# Patient Record
Sex: Female | Born: 1955 | Race: White | Hispanic: No | Marital: Single | Smoking: Never smoker
Health system: Southern US, Community
[De-identification: ages and names within clinical notes are randomized; demographics above are authoritative.]

## PROBLEM LIST (undated history)

## (undated) DIAGNOSIS — R011 Cardiac murmur, unspecified: Secondary | ICD-10-CM

## (undated) HISTORY — DX: Cardiac murmur, unspecified: R01.1

---

## 2014-11-03 ENCOUNTER — Ambulatory Visit (INDEPENDENT_AMBULATORY_CARE_PROVIDER_SITE_OTHER): Payer: Managed Care, Other (non HMO) | Admitting: Emergency Medicine

## 2014-11-03 VITALS — BP 118/60 | HR 78 | Temp 100.0°F | Resp 19 | Ht 58.5 in | Wt 149.1 lb

## 2014-11-03 DIAGNOSIS — J029 Acute pharyngitis, unspecified: Secondary | ICD-10-CM | POA: Diagnosis not present

## 2014-11-03 DIAGNOSIS — R5382 Chronic fatigue, unspecified: Secondary | ICD-10-CM | POA: Diagnosis not present

## 2014-11-03 MED ORDER — PENICILLIN V POTASSIUM 500 MG PO TABS: 500.0000 mg | ORAL_TABLET | Freq: Four times a day (QID) | ORAL | Status: DC

## 2014-11-03 NOTE — Patient Instructions (Signed)

## 2014-11-03 NOTE — Progress Notes (Signed)
Subjective:  [name] ID: Madison [name], female    DOB: 07/06/1955  Age: 59 y.o. MRN: [id number]  CC: Sore Throat; Fever; Headache; Fatigue; and Thyroid Problem   HPI Madison [name] presents  with a sore throat and fever. Madison [name] said Madison [name]'s had a temperature up 100. Madison [name] has a longer term history of fatigue and weight gain. Madison [name] is concerned about Madison possibility of hypothyroidism as Madison [name] explains Madison [name] sisters and Madison [name] mother both have hypothyroidism on replacement. Madison [name] denies any cough coryza nausea vomiting stool change or chills. Has no dysuria urgency or frequency.  History Madison [name] has a past medical history of Heart murmur.   Madison [name] has no past surgical history on file.   Madison [name]  family history includes Cancer in Madison [name] father; Heart disease in Madison [name] father; Hyperlipidemia in Madison [name] father and sister; Hypertension in Madison [name] father; Stroke in Madison [name] maternal grandfather and paternal grandfather.  Madison [name]   reports that Madison [name] has never smoked. Madison [name] does not have any smokeless tobacco history on file. Madison [name] reports that Madison [name] drinks about 4.2 oz of alcohol per week. Madison [name] reports that Madison [name] does not use illicit drugs.  No outpatient prescriptions prior to visit.   No facility-administered medications prior to visit.    History   Social History  . Marital Status: Single    Spouse Name: N/A  . Number of Children: N/A  . Years of Education: N/A   Social History Main Topics  . Smoking status: Never Smoker   . Smokeless tobacco: Not on file  . Alcohol Use: 4.2 oz/week    7 Standard drinks or equivalent per week  . Drug Use: No  . Sexual Activity: Not on file   Other Topics Concern  . None   Social History Narrative  . None     Review of Systems  Constitutional: Negative for fever, chills and appetite change.  HENT: Negative for congestion, ear pain, postnasal drip, sinus pressure and sore throat.   Eyes: Negative for pain and redness.  Respiratory: Negative for cough, shortness of breath and wheezing.     Cardiovascular: Negative for leg swelling.  Gastrointestinal: Negative for nausea, vomiting, abdominal pain, diarrhea, constipation and blood in stool.  Endocrine: Negative for polyuria.  Genitourinary: Negative for dysuria, urgency, frequency and flank pain.  Musculoskeletal: Negative for gait problem.  Skin: Negative for rash.  Neurological: Negative for weakness and headaches.  Psychiatric/Behavioral: Negative for confusion and decreased concentration. Madison [name] is not nervous/anxious.     Objective:  BP 118/60 mmHg  Pulse 78  Temp(Src) 100 F (37.8 C) (Oral)  Resp 19  Ht 4' 10.5" (1.486 m)  Wt 149 lb 2 oz (67.643 kg)  BMI 30.63 kg/m2  SpO2 98%  Physical Exam  Constitutional: Madison [name] is oriented to person, place, and time. Madison [name] appears well-developed and well-nourished. No distress.  HENT:  Head: Normocephalic and atraumatic.  Right Ear: External ear normal.  Left Ear: External ear normal.  Nose: Nose normal.  Mouth/Throat: Posterior oropharyngeal erythema present.  Eyes: Conjunctivae and EOM are normal. Pupils are equal, round, and reactive to light. No scleral icterus.  Neck: Normal range of motion. Neck supple. No tracheal deviation present.  Cardiovascular: Normal rate, regular rhythm and normal heart sounds.   Pulmonary/Chest: Effort normal. No respiratory distress. Madison [name] has no wheezes. Madison [name] has no rales.  Abdominal: Madison [name] exhibits no mass. There is no tenderness. There is no rebound and no guarding.  Musculoskeletal: Madison [name] exhibits no edema.  Lymphadenopathy:    Madison [name] has  no cervical adenopathy.  Neurological: Madison [name] is alert and oriented to person, place, and time. Coordination normal.  Skin: Skin is warm and dry. No rash noted.  Psychiatric: Madison [name] has a normal mood and affect. Madison [name] behavior is normal.      Assessment & Plan:   Madison [name] was seen today for sore throat, fever, headache, fatigue and thyroid problem.  Diagnoses and all orders for this visit:  Chronic  fatigue Orders: -     TSH -     Comprehensive metabolic panel -     CBC  Sore throat Orders: -     TSH -     Comprehensive metabolic panel -     CBC  Other orders -     penicillin v potassium (VEETID) 500 MG tablet; Take 1 tablet (500 mg total) by mouth 4 (four) times daily.   I am having Madison [name] start on penicillin v potassium.  Meds ordered this encounter  Medications  . penicillin v potassium (VEETID) 500 MG tablet    Sig: Take 1 tablet (500 mg total) by mouth 4 (four) times daily.    Dispense:  40 tablet    Refill:  0    Appropriate red flag conditions were discussed with Madison [name] as well as actions that should be taken.  [name] expressed his understanding.  Follow-up: Return if symptoms worsen or fail to improve.  Carmelina DaneAnderson, Breckon Reeves S, MD

## 2014-11-04 LAB — CBC
HCT: 39.1 % (ref 36.0–46.0)
HEMOGLOBIN: 13.3 g/dL (ref 12.0–15.0)
MCH: 28.7 pg (ref 26.0–34.0)
MCHC: 34 g/dL (ref 30.0–36.0)
MCV: 84.3 fL (ref 78.0–100.0)
MPV: 9 fL (ref 8.6–12.4)
PLATELETS: 258 10*3/uL (ref 150–400)
RBC: 4.64 MIL/uL (ref 3.87–5.11)
RDW: 13.5 % (ref 11.5–15.5)
WBC: 8.7 10*3/uL (ref 4.0–10.5)

## 2014-11-04 LAB — COMPREHENSIVE METABOLIC PANEL
ALK PHOS: 67 U/L (ref 39–117)
ALT: 39 U/L — AB (ref 0–35)
AST: 37 U/L (ref 0–37)
Albumin: 4.3 g/dL (ref 3.5–5.2)
BUN: 14 mg/dL (ref 6–23)
CHLORIDE: 105 meq/L (ref 96–112)
CO2: 27 mEq/L (ref 19–32)
CREATININE: 0.89 mg/dL (ref 0.50–1.10)
Calcium: 9.6 mg/dL (ref 8.4–10.5)
Glucose, Bld: 81 mg/dL (ref 70–99)
Potassium: 4.3 mEq/L (ref 3.5–5.3)
SODIUM: 143 meq/L (ref 135–145)
TOTAL PROTEIN: 7 g/dL (ref 6.0–8.3)
Total Bilirubin: 0.5 mg/dL (ref 0.2–1.2)

## 2014-11-04 LAB — TSH: TSH: 2.037 u[IU]/mL (ref 0.350–4.500)

## 2015-07-25 ENCOUNTER — Ambulatory Visit (INDEPENDENT_AMBULATORY_CARE_PROVIDER_SITE_OTHER): Payer: 59 | Admitting: Family Medicine

## 2015-07-25 VITALS — BP 130/70 | HR 93 | Temp 102.1°F | Resp 16 | Ht <= 58 in | Wt 148.0 lb

## 2015-07-25 DIAGNOSIS — R059 Cough, unspecified: Secondary | ICD-10-CM

## 2015-07-25 DIAGNOSIS — R509 Fever, unspecified: Secondary | ICD-10-CM

## 2015-07-25 DIAGNOSIS — Z20828 Contact with and (suspected) exposure to other viral communicable diseases: Secondary | ICD-10-CM | POA: Diagnosis not present

## 2015-07-25 DIAGNOSIS — R05 Cough: Secondary | ICD-10-CM | POA: Diagnosis not present

## 2015-07-25 MED ORDER — OSELTAMIVIR PHOSPHATE 75 MG PO CAPS: 75.0000 mg | ORAL_CAPSULE | Freq: Two times a day (BID) | ORAL | Status: DC

## 2015-07-25 MED ORDER — IBUPROFEN 200 MG PO TABS
400.0000 mg | ORAL_TABLET | Freq: Once | ORAL | Status: AC
  Administered 2015-07-25: 400 mg via ORAL

## 2015-07-25 NOTE — Progress Notes (Signed)
Subjective:  By signing my name below, I, Essence Howell, attest that this documentation has been prepared under the direction and in the presence of Shade Flood, MD Electronically Signed: Charline Bills, ED Scribe 07/25/2015 at 5:52 PM.   Patient ID: Madison [name], female    DOB: 1955-06-02, 60 y.o.   MRN: [id number]  Chief Complaint  Patient presents with  . Cough    x this morning   . Fever  . Chills   HPI  HPI Comments: Madison [name] is a 60 y.o. female who presents to the [facility] complaining of gradual onset of fever onset this morning. Triage temperature 102. F. Pt reports associated symptoms of fatigue, chills, postnasal drip and dry cough onset this morning. No treatments tried PTA. She denies nasal congestion, rhinorrhea, generalized body aches, HA. Pt reports being with her sister 1-2 days ago who was recently diagnosed with influenza A. Pt did not receive an influenza vaccine this season. She denies h/o asthma, COPD or any other pulmonary related illnesses. Pt is a nonsmoker.   There are no active problems to display for this patient.  Past Medical History  Diagnosis Date  . Heart murmur    History reviewed. No pertinent past surgical history. No Known Allergies Prior to Admission medications   Not on File   Social History   Social History  . Marital Status: Single    Spouse Name: N/A  . Number of Children: N/A  . Years of Education: N/A   Occupational History  . Not on file.   Social History Main Topics  . Smoking status: Never Smoker   . Smokeless tobacco: Never Used  . Alcohol Use: 4.2 oz/week    7 Standard drinks or equivalent per week  . Drug Use: No  . Sexual Activity: Not on file   Other Topics Concern  . Not on file   Social History Narrative   Review of Systems  Constitutional: Positive for fever, chills and fatigue.  HENT: Positive for postnasal drip. Negative for congestion and rhinorrhea.   Respiratory: Positive for  cough.   Musculoskeletal: Negative for myalgias.  Neurological: Negative for headaches.      Objective:   Physical Exam  Constitutional: She is oriented to person, place, and time. She appears well-developed and well-nourished. No distress.  HENT:  Head: Normocephalic and atraumatic.  Right Ear: Hearing, tympanic membrane, external ear and ear canal normal.  Left Ear: Hearing, tympanic membrane, external ear and ear canal normal.  Nose: Nose normal.  Mouth/Throat: Oropharynx is clear and moist. No oropharyngeal exudate.  Eyes: Conjunctivae and EOM are normal. Pupils are equal, round, and reactive to light.  Cardiovascular: Normal rate, regular rhythm, normal heart sounds and intact distal pulses.   No murmur heard. Pulmonary/Chest: Effort normal and breath sounds normal. No respiratory distress. She has no wheezes. She has no rhonchi.  Neurological: She is alert and oriented to person, place, and time.  Skin: Skin is warm and dry. No rash noted.  Psychiatric: She has a normal mood and affect. Her behavior is normal.  Vitals reviewed.     Assessment & Plan:   Madison [name] is a 60 y.o. female Exposure to influenza - Plan: oseltamivir (TAMIFLU) 75 MG capsule  Fever, unspecified - Plan: ibuprofen (ADVIL,MOTRIN) tablet 400 mg, oseltamivir (TAMIFLU) 75 MG capsule  Cough - Plan: oseltamivir (TAMIFLU) 75 MG capsule  Clinically appears to be influenza. Declined flu testing. We'll treat as likely flu with Tamiflu  75 mg twice a day after discussion of pros and cons of this medicine were discussed. . Symptomatically care discussed with Mucinex, antipyretics, RTC precautions  Meds ordered this encounter  Medications  . ibuprofen (ADVIL,MOTRIN) tablet 400 mg    Sig:   . oseltamivir (TAMIFLU) 75 MG capsule    Sig: Take 1 capsule (75 mg total) by mouth 2 (two) times daily.    Dispense:  10 capsule    Refill:  0   Patient Instructions       IF you received an x-ray today, you will  receive an invoice from [facility]. Please contact [facility] at [phone] with questions or concerns regarding your invoice.   IF you received labwork today, you will receive an invoice from [employer] [facility]. Please contact [facility] at [phone] with questions or concerns regarding your invoice.   Our billing staff will not be able to assist you with questions regarding bills from these companies.  You will be contacted with the lab results as soon as they are available. The fastest way to get your results is to activate your My Chart account. Instructions are located on the last page of this paperwork. If you have not heard from [place] regarding the results in 2 weeks, please contact this office.    Can start Tamiflu as discussed. Saline nasal spray at least 4 times per day if needed for nasal congestion, over the counter mucinex or mucinex DM as needed for cough, tylenol or ibuprofen over the counter for fever and body aches, and drink plenty of fluids. Other information as in instructions below.  Return to the clinic or go to the nearest emergency room if any of your symptoms worsen or new symptoms occur.Out of work until you are fever free for 24 hours off a fever reducing medication.  Influenza, Adult Influenza ("the flu") is a viral infection of the respiratory tract. It occurs more often in winter months because people spend more time in close contact with one another. Influenza can make you feel very sick. Influenza easily spreads from person to person (contagious). CAUSES  Influenza is caused by a virus that infects the respiratory tract. You can catch the virus by breathing in droplets from an infected person's cough or sneeze. You can also catch the virus by touching something that was recently contaminated with the virus and then touching your mouth, nose, or eyes. RISKS AND COMPLICATIONS You may be at risk for a more severe case of influenza if  you smoke cigarettes, have diabetes, have chronic heart disease (such as heart failure) or lung disease (such as asthma), or if you have a weakened immune system. Elderly people and pregnant women are also at risk for more serious infections. The most common problem of influenza is a lung infection (pneumonia). Sometimes, this problem can require emergency medical care and may be life threatening. SIGNS AND SYMPTOMS  Symptoms typically last 4 to 10 days and may include:  Fever.  Chills.  Headache, body aches, and muscle aches.  Sore throat.  Chest discomfort and cough.  Poor appetite.  Weakness or feeling tired.  Dizziness.  Nausea or vomiting. DIAGNOSIS  Diagnosis of influenza is often made based on your history and a physical exam. A nose or throat swab test can be done to confirm the diagnosis. TREATMENT  In mild cases, influenza goes away on its own. Treatment is directed at relieving symptoms. For more severe cases, your health care provider may prescribe antiviral medicines  to shorten the sickness. Antibiotic medicines are not effective because the infection is caused by a virus, not by bacteria. HOME CARE INSTRUCTIONS  Take medicines only as directed by your health care provider.  Use a cool mist humidifier to make breathing easier.  Get plenty of rest until your temperature returns to normal. This usually takes 3 to 4 days.  Drink enough fluid to keep your urine clear or pale yellow.  Cover yourmouth and nosewhen coughing or sneezing,and wash your handswellto prevent thevirusfrom spreading.  Stay homefromwork orschool untilthe fever is gonefor at least 1full day.49 PREVENTION  An annual influenza vaccination (flu shot) is the best way to avoid getting influenza. An annual flu shot is now routinely recommended for all adults in the [place] SEEK MEDICAL CARE IF:  You experiencechest pain, yourcough worsens,or you producemore mucus.  Youhave  nausea,vomiting, ordiarrhea.  Your fever returns or gets worse. SEEK IMMEDIATE MEDICAL CARE IF:  You havetrouble breathing, you become short of breath,or your skin ornails becomebluish.  You have severe painor stiffnessin the neck.  You develop a sudden headache, or pain in the face or ear.  You have nausea or vomiting that you cannot control. MAKE SURE YOU:   Understand these instructions.  Will watch your condition.  Will get help right away if you are not doing well or get worse.   This information is not intended to replace advice given to you by your health care provider. Make sure you discuss any questions you have with your health care provider.   Document Released: 03/30/2000 Document Revised: 04/23/2014 Document Reviewed: 07/02/2011 Elsevier Interactive Patient Education [employer]2016 Elsevier Inc.       I personally performed the services described in this documentation, which was scribed in my presence. The recorded information has been reviewed and considered, and addended by me as needed.

## 2015-07-25 NOTE — Patient Instructions (Addendum)
IF you received an x-ray today, you will receive an invoice from [facility]. Please contact [facility] at [phone] with questions or concerns regarding your invoice.   IF you received labwork today, you will receive an invoice from [facility]. Please contact [facility] at [phone] with questions or concerns regarding your invoice.   Our billing staff will not be able to assist you with questions regarding bills from these companies.  You will be contacted with the lab results as soon as they are available. The fastest way to get your results is to activate your My Chart account. Instructions are located on the last page of this paperwork. If you have not heard from us regarding the results in 2 weeks, please contact this office.    Can start Tamiflu as discussed. Saline nasal spray at least 4 times per day if needed for nasal congestion, over the counter mucinex or mucinex DM as needed for cough, tylenol or ibuprofen over the counter for fever and body aches, and drink plenty of fluids. Other information as in instructions below.  Return to the clinic or go to the nearest emergency room if any of your symptoms worsen or new symptoms occur.Out of work until you are fever free for 24 hours off a fever reducing medication.  Influenza, Adult Influenza ("the flu") is a viral infection of the respiratory tract. It occurs more often in winter months because people spend more time in close contact with one another. Influenza can make you feel very sick. Influenza easily spreads from person to person (contagious). CAUSES  Influenza is caused by a virus that infects the respiratory tract. You can catch the virus by breathing in droplets from an infected person's cough or sneeze. You can also catch the virus by touching something that was recently contaminated with the virus and then touching your mouth, nose, or eyes. RISKS AND COMPLICATIONS You may  be at risk for a more severe case of influenza if you smoke cigarettes, have diabetes, have chronic heart disease (such as heart failure) or lung disease (such as asthma), or if you have a weakened immune system. Elderly people and pregnant women are also at risk for more serious infections. The most common problem of influenza is a lung infection (pneumonia). Sometimes, this problem can require emergency medical care and may be life threatening. SIGNS AND SYMPTOMS  Symptoms typically last 4 to 10 days and may include:  Fever.  Chills.  Headache, body aches, and muscle aches.  Sore throat.  Chest discomfort and cough.  Poor appetite.  Weakness or feeling tired.  Dizziness.  Nausea or vomiting. DIAGNOSIS  Diagnosis of influenza is often made based on your history and a physical exam. A nose or throat swab test can be done to confirm the diagnosis. TREATMENT  In mild cases, influenza goes away on its own. Treatment is directed at relieving symptoms. For more severe cases, your health care provider may prescribe antiviral medicines to shorten the sickness. Antibiotic medicines are not effective because the infection is caused by a virus, not by bacteria. HOME CARE INSTRUCTIONS  Take medicines only as directed by your health care provider.  Use a cool mist humidifier to make breathing easier.  Get plenty of rest until your temperature returns to normal. This usually takes 3 to 4 days.  Drink enough fluid to keep your urine clear or pale yellow.  Cover yourmouth and nosewhen coughing or sneezing,and wash your handswellto prevent thevirusfrom spreading.  Stay homefromwork orschool untilthe fever is gonefor at least 34full day. PREVENTION  An annual influenza vaccination (flu shot) is the best way to avoid getting influenza. An annual flu shot is now routinely recommended for all adults in the [place] SEEK MEDICAL CARE IF:  You experiencechest pain, yourcough worsens,or  you producemore mucus.  Youhave nausea,vomiting, ordiarrhea.  Your fever returns or gets worse. SEEK IMMEDIATE MEDICAL CARE IF:  You havetrouble breathing, you become short of breath,or your skin ornails becomebluish.  You have severe painor stiffnessin the neck.  You develop a sudden headache, or pain in the face or ear.  You have nausea or vomiting that you cannot control. MAKE SURE YOU:   Understand these instructions.  Will watch your condition.  Will get help right away if you are not doing well or get worse.   This information is not intended to replace advice given to you by your health care provider. Make sure you discuss any questions you have with your health care provider.   Document Released: 03/30/2000 Document Revised: 04/23/2014 Document Reviewed: 07/02/2011 Elsevier Interactive Patient Education [employer]

## 2016-04-17 ENCOUNTER — Inpatient Hospital Stay: Admit: 2016-04-17 | Payer: BLUE CROSS/BLUE SHIELD

## 2016-04-18 NOTE — Progress Notes (Signed)
[facility]  [address], [address], [place], [place] [id number]  Phone: [phone]  Fax: [phone]   Nutrition Assessment ??? Medical Nutrition Therapy   Outpatient Initial Evaluation         Patient Name: Sabrina Hampton DOB: 02/25/1956   Treatment Diagnosis: obesity   Referral Source: Dellia CloudWatson, Betyshia J, MD Start of Care [facility]): 04-17-2016     Gender: female Age: 61 y.o.   Ht: 62 In Wt: 208 lb  kg   BMI: 38 BMR   Female  BMR Female 1471   Anthropometrics Assessment: Pt is considered obese, per BMI.     Past Medical History includes: HTN; HLD;      Pertinent Medications:        Biochemical Data:   No results found for: HBA1C, HGBE8, HBA1CPOC, HBA1CEXT  No results found for: CHOL, CHOLPOCT, CHOLX, CHLST, CHOLV, HDL, HDLPOC, LDL, LDLCPOC, LDLC, DLDLP, VLDLC, VLDL, TGLX, TRIGL, TRIGP, TGLPOCT, CHHD, CHHDX  No results found for: GPT, ALT, SGOT, GGT, GGTP, AP, APIT, APX, CBIL, TBIL, TBILI  No results found for: CRES, CREAPOC, MCREA, ACREA, CREA, REFC3, REFC4  No results found for: BUN, BUNPOC, IBUN, MBUNV, BUNV  No results found for: MCACR, MCA1, MCA2, MCA3, MCAU, MCAU2, MCALPOCT     Subjective/Assessment:     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  She has finally decided to make healthier changes in her diet and lifestyle.  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  Her next trip is scheduled for the end of January.  Pt works from home when she is not traveling and lives with her husband.  Although she is motivated to improve her health and wellness, pt is not ready to make the necessary changes aggressively.       Current Eating Patterns: Pt's current diet is very inconsistent in timing and content.  Her overall intake is typically high in carbohydrate and fat.  She admits to having 1 drink per day (heavy beer or bourbon straight).       Estimate Needs   Calories: 1400  Protein: 105 Carbs: 140 Fat: 47   Kcal/day  g/day  g/day  g/day         percent: 30  40  30               Education & Recommendations provided: Provided pt with information regarding macronutrient composition of favorite foods, along with specific recommendations for intake of each throughout the day.   Handouts Provided:   Carbohydrates    Protein    Fiber    Serving [employer] Insurance[employer]izes    Meal and Snack Ideas    Food Journals   Diabetes    Cholesterol    Sodium    Gen Nutr Guidelines    SBGM Guidelines    Others: Meal Builder and diet plan based on 1400 calories   Information Reviewed with: pt   Readiness to Change Stage:   Pre-contemplative      Contemplative    Preparation                 Action                    Maintenance   Potential Barriers to Learning:   Decline in memory      Language barrier     Other:    Emotional  Limited mobility    Lack of motivation      Vision, hearing or cognitive impairment   Expected Compliance: / Fair due to pt's current level of motivation.     Nutritional Goal - To promote lifestyle changes to result in:      Weight loss    Improved diabetic control    Decreased cholesterol levels    Decreased blood pressure    Weight maintenance   Preventing any interactions associated with food allergies    Adequate weight gain toward goal weight    Other:        Patient Goals:  ???SMART goals??? 1. Always combine protein and carbohydrate  2. Eat 2-3 hrs after snacks and 4-5 hrs after meals     Dietitian Signature: Jackolyn Confer, MS, RD, CSSD Date: 04/18/2016   Follow-up: Wed Jan 17 @ 11:30 Time: 1:11 PM

## 2017-06-22 ENCOUNTER — Other Ambulatory Visit: Payer: Self-pay

## 2017-06-22 ENCOUNTER — Encounter (HOSPITAL_COMMUNITY): Payer: Self-pay | Admitting: *Deleted

## 2017-06-22 ENCOUNTER — Ambulatory Visit (INDEPENDENT_AMBULATORY_CARE_PROVIDER_SITE_OTHER): Payer: 59

## 2017-06-22 ENCOUNTER — Ambulatory Visit (HOSPITAL_COMMUNITY)
Admission: EM | Admit: 2017-06-22 | Discharge: 2017-06-22 | Disposition: A | Discharge status: Home / Self Care | Payer: 59 | Attending: Family Medicine | Admitting: Family Medicine

## 2017-06-22 DIAGNOSIS — W19XXXA Unspecified fall, initial encounter: Secondary | ICD-10-CM | POA: Diagnosis not present

## 2017-06-22 DIAGNOSIS — S52502A Unspecified fracture of the lower end of left radius, initial encounter for closed fracture: Secondary | ICD-10-CM | POA: Diagnosis not present

## 2017-06-22 MED ORDER — MELOXICAM 7.5 MG PO TABS: 7.5000 mg | ORAL_TABLET | Freq: Every day | ORAL | 0 refills | Status: DC

## 2017-06-22 MED ORDER — TRAMADOL HCL 50 MG PO TABS: 50.0000 mg | ORAL_TABLET | Freq: Four times a day (QID) | ORAL | 0 refills | Status: DC | PRN

## 2017-06-22 NOTE — ED Provider Notes (Signed)
[facility]    CSN: [id number] Arrival date & time: 06/22/17  1431     History   Chief Complaint Chief Complaint  Patient presents with  . Wrist Injury    HPI Madison [name] is a 62 y.o. female.   62 year old female comes in for left wrist pain after fall earlier today. States she was rollerblading, when she fell backwards.  She stretched her arms backwards to break the fall, and has since had left wrist pain.  She denies head injury, loss of consciousness.  Denies back pain, saddle anesthesia, loss of bladder or bowel control.  Has swelling to the radial aspect of the wrist, with painful and decreased range of motion.  Denies numbness, tingling.  Able to move her fingers.  Took ibuprofen with little relief.  Has been doing ice compress to help with the swelling.      Past Medical History:  Diagnosis Date  . Heart murmur     There are no active problems to display for this patient.   History reviewed. No pertinent surgical history.  OB History    No data available       Home Medications    Prior to Admission medications   Medication Sig Start Date End Date Taking? Authorizing Provider  meloxicam (MOBIC) 7.5 MG tablet Take 1 tablet (7.5 mg total) by mouth daily. 06/22/17   Cathie HoopsYu, Francia Verry V, PA-C  traMADol (ULTRAM) 50 MG tablet Take 1 tablet (50 mg total) by mouth every 6 (six) hours as needed. 06/22/17   Belinda FisherYu, Harmani Neto V, PA-C    Family History Family History  Problem Relation Age of Onset  . Cancer Father   . Heart disease Father   . Hyperlipidemia Father   . Hypertension Father   . Stroke Maternal Grandfather   . Stroke Paternal Grandfather   . Hyperlipidemia Sister     Social History Social History   Tobacco Use  . Smoking status: Never Smoker  . Smokeless tobacco: Never Used  Substance Use Topics  . Alcohol use: Yes    Alcohol/week: 4.2 oz    Types: 7 Standard drinks or equivalent per week  . Drug use: No     Allergies   Patient has no known  allergies.   Review of Systems Review of Systems  Reason unable to perform ROS: See HPI as above.     Physical Exam Triage Vital Signs ED Triage Vitals  Enc Vitals Group     BP 06/22/17 1616 (!) 164/70     Pulse Rate 06/22/17 1616 61     Resp 06/22/17 1616 16     Temp 06/22/17 1616 98.9 F (37.2 C)     Temp Source 06/22/17 1616 Oral     SpO2 06/22/17 1616 98 %     Weight --      Height --      Head Circumference --      Peak Flow --      Pain Score 06/22/17 1617 4     Pain Loc --      Pain Edu? --      Excl. in GC? --    No data found.  Updated Vital Signs BP (!) 164/70   Pulse 61   Temp 98.9 F (37.2 C) (Oral)   Resp 16   SpO2 98%   Physical Exam  Constitutional: She is oriented to person, place, and time. She appears well-developed and well-nourished. No distress.  HENT:  Head: Normocephalic  and atraumatic.  Eyes: Conjunctivae are normal. Pupils are equal, round, and reactive to light.  Musculoskeletal:  Swelling of the radial and ulnar aspect of the wrist.  No erythema, increased warmth, contusions.  Tenderness to palpation of radial aspect of the wrist.  No tenderness of palpation of the hand.  Range of motion of wrist deferred.  Full range of motion of fingers.  Strength deferred.  Sensation intact and equal bilaterally.  Radial pulse 2+ and equal bilaterally.  Cap refill less than 2 seconds.  Neurological: She is alert and oriented to person, place, and time.     UC Treatments / Results  Labs (all labs ordered are listed, but only abnormal results are displayed) Labs Reviewed - No data to display  EKG  EKG Interpretation None       Radiology Dg Wrist Complete Left  Result Date: 06/22/2017 CLINICAL DATA:  Left wrist pain after fall while rollerblading. EXAM: LEFT WRIST - COMPLETE 3+ VIEW COMPARISON:  None. FINDINGS: There is an acute, nondisplaced fracture through the distal radial metaphysis. No definite intra-articular extension. No additional  fracture. No dislocation. Joint spaces are preserved. Bone mineralization is normal. Soft tissue swelling about the wrist. IMPRESSION: Acute, nondisplaced fracture of the distal radius. Electronically Signed   By: Obie Dredge M.D.   On: 06/22/2017 16:46    Procedures Procedures (including critical care time)  Medications Ordered in UC Medications - No data to display   Initial Impression / Assessment and Plan / UC Course  I have reviewed the triage vital signs and the nursing notes.  Pertinent labs & imaging results that were available during my care of the patient were reviewed by me and considered in my medical decision making (see chart for details).    Sugar tongue splint.  Mobic for pain.  Tylenol for breakthrough pain.  Tramadol sparingly for severe pain.  Follow-up with orthopedics for further evaluation and management needed.  Return precautions given.  Patient expresses understanding and agrees to plan.  Final Clinical Impressions(s) / UC Diagnoses   Final diagnoses:  Closed fracture of distal end of left radius, unspecified fracture morphology, initial encounter    ED Discharge Orders        Ordered    meloxicam (MOBIC) 7.5 MG tablet  Daily     06/22/17 1715    traMADol (ULTRAM) 50 MG tablet  Every 6 hours PRN     06/22/17 1715       Controlled Substance Prescriptions [place] Controlled Substance Registry consulted? Yes, I have consulted the [place] Controlled Substances Registry for this patient, and feel the risk/benefit ratio today is favorable for proceeding with this prescription for a controlled substance.   Belinda Fisher, PA-C 06/22/17 1740

## 2017-06-22 NOTE — ED Triage Notes (Signed)
Reports falling while rollerblading this AM, injuring left wrist.  C/O pain to left wrist only.  CMS intact.

## 2017-06-22 NOTE — Discharge Instructions (Signed)
Start Mobic as directed.  Tylenol as needed for breakthrough pain.  Tramadol as needed for severe pain.  Continue ice compress to help with the swelling.  Follow-up with orthopedics on Monday for further evaluation and management needed.

## 2017-06-22 NOTE — Progress Notes (Signed)
Orthopedic Tech Progress Note Patient Details:  Madison [name] 07/16/1955 [id number]  Ortho Devices Type of Ortho Device: Arm sling, Sugartong splint Ortho Device/Splint Interventions: Application   Post Interventions Patient Tolerated: Well Instructions Provided: Care of device   Madison [name] 06/22/2017, 5:37 PM

## 2017-06-24 ENCOUNTER — Encounter (INDEPENDENT_AMBULATORY_CARE_PROVIDER_SITE_OTHER): Payer: Self-pay | Admitting: Orthopaedic Surgery

## 2017-06-24 ENCOUNTER — Ambulatory Visit (INDEPENDENT_AMBULATORY_CARE_PROVIDER_SITE_OTHER): Payer: 59 | Admitting: Orthopaedic Surgery

## 2017-06-24 DIAGNOSIS — S52532A Colles' fracture of left radius, initial encounter for closed fracture: Secondary | ICD-10-CM | POA: Diagnosis not present

## 2017-06-24 MED ORDER — CALCIUM CARBONATE-VITAMIN D 500-200 MG-UNIT PO TABS: 1.0000 | ORAL_TABLET | Freq: Three times a day (TID) | ORAL | 12 refills | Status: AC

## 2017-06-24 NOTE — Progress Notes (Signed)
Office Visit Note   Patient: Madison [name]           Date of Birth: 07/26/1955           MRN: [id number] Visit Date: 06/24/2017              Requested by: No referring provider defined for this encounter. PCP: Patient, No Pcp Per   Assessment & Plan: Visit Diagnoses:  1. Closed Colles' fracture of left radius, initial encounter     Plan: Impression is nondisplaced left distal radius fracture.  We will plan on treating this nonoperatively in a short arm cast.  Prescription for vitamin D and calcium.  Follow-up in 4 weeks with 2 view x-rays of the left wrist out of the cast.  Anticipate beginning hand therapy at that time.  Follow-Up Instructions: Return in about 4 weeks (around 07/22/2017).   Orders:  No orders of the defined types were placed in this encounter.  Meds ordered this encounter  Medications  . calcium-vitamin D (OSCAL WITH D) 500-200 MG-UNIT tablet    Sig: Take 1 tablet by mouth 3 (three) times daily.    Dispense:  90 tablet    Refill:  12      Procedures: No procedures performed   Clinical Data: No additional findings.   Subjective: Chief Complaint  Patient presents with  . Left Wrist - Fracture, Pain     Patient is a very pleasant right-hand-dominant 62 year old female who had a mechanical fall 2 days ago onto her left wrist.  She sustained a nondisplaced distal radius fracture.  She was evaluated in the ED.  She follows up today for further evaluation and treatment.  She denies any carpal tunnel symptoms.    Review of Systems  Constitutional: Negative.   HENT: Negative.   Eyes: Negative.   Respiratory: Negative.   Cardiovascular: Negative.   Endocrine: Negative.   Musculoskeletal: Negative.   Neurological: Negative.   Hematological: Negative.   Psychiatric/Behavioral: Negative.   All other systems reviewed and are negative.    Objective: Vital Signs: There were no vitals taken for this visit.  Physical Exam  Constitutional: She is  oriented to person, place, and time. She appears well-developed and well-nourished.  HENT:  Head: Normocephalic and atraumatic.  Eyes: EOM are normal.  Neck: Neck supple.  Pulmonary/Chest: Effort normal.  Abdominal: Soft.  Neurological: She is alert and oriented to person, place, and time.  Skin: Skin is warm. Capillary refill takes less than 2 seconds.  Psychiatric: She has a normal mood and affect. Her behavior is normal. Judgment and thought content normal.  Nursing note and vitals reviewed.   Ortho Exam Left hand and wrist exam shows moderate swelling.  Compartments are soft.  No neurovascular compromise. Specialty Comments:  No specialty comments available.  Imaging: No results found.   PMFS History: There are no active problems to display for this patient.  Past Medical History:  Diagnosis Date  . Heart murmur     Family History  Problem Relation Age of Onset  . Cancer Father   . Heart disease Father   . Hyperlipidemia Father   . Hypertension Father   . Stroke Maternal Grandfather   . Stroke Paternal Grandfather   . Hyperlipidemia Sister     History reviewed. No pertinent surgical history. Social History   Occupational History  . Not on file  Tobacco Use  . Smoking status: Never Smoker  . Smokeless tobacco: Never Used  Substance and Sexual Activity  .  Alcohol use: Yes    Alcohol/week: 4.2 oz    Types: 7 Standard drinks or equivalent per week  . Drug use: No  . Sexual activity: Not on file

## 2017-07-25 ENCOUNTER — Ambulatory Visit (INDEPENDENT_AMBULATORY_CARE_PROVIDER_SITE_OTHER): Payer: 59 | Admitting: Orthopaedic Surgery

## 2017-07-25 ENCOUNTER — Encounter (INDEPENDENT_AMBULATORY_CARE_PROVIDER_SITE_OTHER): Payer: Self-pay | Admitting: Orthopaedic Surgery

## 2017-07-25 ENCOUNTER — Ambulatory Visit (INDEPENDENT_AMBULATORY_CARE_PROVIDER_SITE_OTHER): Payer: 59

## 2017-07-25 DIAGNOSIS — S52532A Colles' fracture of left radius, initial encounter for closed fracture: Secondary | ICD-10-CM

## 2017-07-25 NOTE — Progress Notes (Signed)
   Office Visit Note   Patient: Madison GaveDonna [name]           Date of Birth: 12/22/1955           MRN: [id number] Visit Date: 07/25/2017              Requested by: No referring provider defined for this encounter. PCP: Patient, No Pcp Per   Assessment & Plan: Visit Diagnoses:  1. Closed Colles' fracture of left radius, initial encounter     Plan: At this point, we will remove the cast.  We will place her in a removable wrist splint.  She will remain nonweightbearing to the left upper extremity.  We will start her in hand therapy.  A prescription was given to the patient today for this.  Elevate for swelling.  She will follow-up with us in 4 weeks time for repeat evaluation and x-ray.  Call with concerns or questions in the meantime.  Follow-Up Instructions: Return in about 1 month (around 08/22/2017).   Orders:  Orders Placed This Encounter  Procedures  . XR Wrist 2 Views Left   No orders of the defined types were placed in this encounter.     Procedures: No procedures performed   Clinical Data: No additional findings.   Subjective: Chief Complaint  Patient presents with  . Left Wrist - Pain, Follow-up    HPI Madison LeashDonna comes in for follow-up.  4 weeks out left Colles' fracture.  She has been in a cast nonweightbearing.  Doing well with this.  No pain until today when we removed the cast.  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  Review of Systems as detailed in HPI.  All others reviewed and are negative.   Objective: Vital Signs: There were no vitals taken for this visit.  Physical Exam well-developed well-nourished female in no acute distress.  Alert and oriented x3.  Ortho Exam examination of the left wrist reveals moderate tenderness over the distal radius.  Moderate swelling to the hand.  She is neurovascularly intact distally.   Specialty Comments:  No specialty comments available.  Imaging: Xr Wrist 2 Views Left  Result Date:  07/25/2017 X-rays show a well healing distal radius fracture without collapse    PMFS History: Patient Active Problem List   Diagnosis Date Noted  . Fracture, Colles, left, closed 07/25/2017   Past Medical History:  Diagnosis Date  . Heart murmur     Family History  Problem Relation Age of Onset  . Cancer Father   . Heart disease Father   . Hyperlipidemia Father   . Hypertension Father   . Stroke Maternal Grandfather   . Stroke Paternal Grandfather   . Hyperlipidemia Sister     History reviewed. No pertinent surgical history. Social History   Occupational History  . Not on file  Tobacco Use  . Smoking status: Never Smoker  . Smokeless tobacco: Never Used  Substance and Sexual Activity  . Alcohol use: Yes    Alcohol/week: 4.2 oz    Types: 7 Standard drinks or equivalent per week  . Drug use: No  . Sexual activity: Not on file

## 2017-08-13 ENCOUNTER — Telehealth (INDEPENDENT_AMBULATORY_CARE_PROVIDER_SITE_OTHER): Payer: Self-pay | Admitting: Orthopaedic Surgery

## 2017-08-13 NOTE — Telephone Encounter (Signed)
Marisue Ivan from [facility] called asking about the patients signed eval. If you could give her a call back at [phone]

## 2017-08-15 NOTE — Telephone Encounter (Signed)
Tried to call back, line is busy will try again later. I do not have anything on patient they will need to refax. Please fax to fax number [phone]

## 2017-08-16 NOTE — Telephone Encounter (Signed)
She advised they have been sending it to incorrect fax number she will refax it.

## 2017-08-21 DIAGNOSIS — M79642 Pain in left hand: Secondary | ICD-10-CM | POA: Diagnosis not present

## 2017-08-21 DIAGNOSIS — M25632 Stiffness of left wrist, not elsewhere classified: Secondary | ICD-10-CM | POA: Diagnosis not present

## 2017-08-21 DIAGNOSIS — M25532 Pain in left wrist: Secondary | ICD-10-CM | POA: Diagnosis not present

## 2017-08-22 ENCOUNTER — Telehealth (INDEPENDENT_AMBULATORY_CARE_PROVIDER_SITE_OTHER): Payer: Self-pay | Admitting: Orthopaedic Surgery

## 2017-08-22 NOTE — Telephone Encounter (Signed)
[facility] from [facility] called requesting for the last patient eval to be sent back to them once signed at [phone]

## 2017-08-23 NOTE — Telephone Encounter (Signed)
I called Marisue Ivan and asked her to re fax to [phone] as we still don't have it

## 2017-08-23 NOTE — Telephone Encounter (Signed)
Have you seen this eval PT sheet  for patient ? I have not received this

## 2017-08-26 ENCOUNTER — Encounter (INDEPENDENT_AMBULATORY_CARE_PROVIDER_SITE_OTHER): Payer: Self-pay | Admitting: Orthopaedic Surgery

## 2017-08-26 ENCOUNTER — Ambulatory Visit (INDEPENDENT_AMBULATORY_CARE_PROVIDER_SITE_OTHER): Payer: 59

## 2017-08-26 ENCOUNTER — Ambulatory Visit (INDEPENDENT_AMBULATORY_CARE_PROVIDER_SITE_OTHER): Payer: 59 | Admitting: Orthopaedic Surgery

## 2017-08-26 DIAGNOSIS — S52532A Colles' fracture of left radius, initial encounter for closed fracture: Secondary | ICD-10-CM

## 2017-08-26 DIAGNOSIS — M25632 Stiffness of left wrist, not elsewhere classified: Secondary | ICD-10-CM | POA: Diagnosis not present

## 2017-08-26 DIAGNOSIS — M25532 Pain in left wrist: Secondary | ICD-10-CM | POA: Diagnosis not present

## 2017-08-26 DIAGNOSIS — M79642 Pain in left hand: Secondary | ICD-10-CM | POA: Diagnosis not present

## 2017-08-26 NOTE — Progress Notes (Signed)
   Post-Op Visit Note   Patient: Madison [name]           Date of Birth: Mar 07, 1956           MRN: [id number] Visit Date: 08/26/2017 PCP: Patient, No Pcp Per   Assessment & Plan:  Chief Complaint:  Chief Complaint  Patient presents with  . Left Wrist - Pain   Visit Diagnoses:  1. Closed Colles' fracture of left radius, initial encounter     Plan: Madison [name] is 8 weeks status post nondisplaced distal radius fracture.  She is overall doing well.  She is not taking anything for pain.  She is working with hand therapy and range of motion and strength are progressing.  X-rays demonstrate full healing of the fracture.  At this point she is released to continue to work on hand therapy for range of motion and strengthening.  From my standpoint we will see her back as needed.  Follow-Up Instructions: Return if symptoms worsen or fail to improve.   Orders:  Orders Placed This Encounter  Procedures  . XR Wrist Complete Left   No orders of the defined types were placed in this encounter.   Imaging: Xr Wrist Complete Left  Result Date: 08/26/2017 Healed distal radius fracture   PMFS History: Patient Active Problem List   Diagnosis Date Noted  . Fracture, Colles, left, closed 07/25/2017   Past Medical History:  Diagnosis Date  . Heart murmur     Family History  Problem Relation Age of Onset  . Cancer Father   . Heart disease Father   . Hyperlipidemia Father   . Hypertension Father   . Stroke Maternal Grandfather   . Stroke Paternal Grandfather   . Hyperlipidemia Sister     History reviewed. No pertinent surgical history. Social History   Occupational History  . Not on file  Tobacco Use  . Smoking status: Never Smoker  . Smokeless tobacco: Never Used  Substance and Sexual Activity  . Alcohol use: Yes    Alcohol/week: 4.2 oz    Types: 7 Standard drinks or equivalent per week  . Drug use: No  . Sexual activity: Not on file

## 2017-08-28 DIAGNOSIS — M25632 Stiffness of left wrist, not elsewhere classified: Secondary | ICD-10-CM | POA: Diagnosis not present

## 2017-08-28 DIAGNOSIS — M79642 Pain in left hand: Secondary | ICD-10-CM | POA: Diagnosis not present

## 2017-08-28 DIAGNOSIS — M25532 Pain in left wrist: Secondary | ICD-10-CM | POA: Diagnosis not present

## 2017-09-02 DIAGNOSIS — M79642 Pain in left hand: Secondary | ICD-10-CM | POA: Diagnosis not present

## 2017-09-02 DIAGNOSIS — M25632 Stiffness of left wrist, not elsewhere classified: Secondary | ICD-10-CM | POA: Diagnosis not present

## 2017-09-02 DIAGNOSIS — M25532 Pain in left wrist: Secondary | ICD-10-CM | POA: Diagnosis not present

## 2017-09-04 DIAGNOSIS — M79642 Pain in left hand: Secondary | ICD-10-CM | POA: Diagnosis not present

## 2017-09-04 DIAGNOSIS — M25632 Stiffness of left wrist, not elsewhere classified: Secondary | ICD-10-CM | POA: Diagnosis not present

## 2017-09-04 DIAGNOSIS — M25532 Pain in left wrist: Secondary | ICD-10-CM | POA: Diagnosis not present

## 2017-09-11 DIAGNOSIS — M25632 Stiffness of left wrist, not elsewhere classified: Secondary | ICD-10-CM | POA: Diagnosis not present

## 2017-09-11 DIAGNOSIS — M25532 Pain in left wrist: Secondary | ICD-10-CM | POA: Diagnosis not present

## 2017-09-11 DIAGNOSIS — M79642 Pain in left hand: Secondary | ICD-10-CM | POA: Diagnosis not present

## 2017-09-16 DIAGNOSIS — M79642 Pain in left hand: Secondary | ICD-10-CM | POA: Diagnosis not present

## 2017-09-16 DIAGNOSIS — M25632 Stiffness of left wrist, not elsewhere classified: Secondary | ICD-10-CM | POA: Diagnosis not present

## 2017-09-16 DIAGNOSIS — M25532 Pain in left wrist: Secondary | ICD-10-CM | POA: Diagnosis not present

## 2017-09-23 ENCOUNTER — Telehealth (INDEPENDENT_AMBULATORY_CARE_PROVIDER_SITE_OTHER): Payer: Self-pay | Admitting: Physician Assistant

## 2017-09-23 NOTE — Telephone Encounter (Signed)
Marisue IvanLiz from [facility] called asking for a signed plan of care that was faxed over on 5/8 to be sent back asap. CB # [phone]

## 2017-09-24 NOTE — Telephone Encounter (Signed)
Do you know if this was faxed already?  

## 2017-09-25 NOTE — Telephone Encounter (Signed)
IC advised need to re send

## 2017-10-01 ENCOUNTER — Telehealth (INDEPENDENT_AMBULATORY_CARE_PROVIDER_SITE_OTHER): Payer: Self-pay | Admitting: Physician Assistant

## 2017-10-01 NOTE — Telephone Encounter (Signed)
Marisue IvanLiz (PT) with [facility] called to follow up on plan of care orders faxed over for the patient. The number to contact Marisue IvanLiz id [phone]

## 2017-10-02 NOTE — Telephone Encounter (Signed)
I have not recieved fax have you?

## 2018-02-15 DIAGNOSIS — Z23 Encounter for immunization: Secondary | ICD-10-CM | POA: Diagnosis not present

## 2018-06-25 DIAGNOSIS — Z6830 Body mass index (BMI) 30.0-30.9, adult: Secondary | ICD-10-CM | POA: Diagnosis not present

## 2018-06-25 DIAGNOSIS — J069 Acute upper respiratory infection, unspecified: Secondary | ICD-10-CM | POA: Diagnosis not present

## 2018-06-25 DIAGNOSIS — E6609 Other obesity due to excess calories: Secondary | ICD-10-CM | POA: Diagnosis not present

## 2018-11-05 ENCOUNTER — Other Ambulatory Visit (HOSPITAL_COMMUNITY): Payer: Self-pay | Admitting: Family Medicine

## 2018-11-05 DIAGNOSIS — Z1231 Encounter for screening mammogram for malignant neoplasm of breast: Secondary | ICD-10-CM

## 2019-05-15 IMAGING — DX DG WRIST COMPLETE 3+V*L*
4 series · 4 of 4 positions shown · non-contrast
Comparison: None.

CLINICAL DATA: Left wrist pain after fall while rollerblading.

EXAM:
LEFT WRIST - COMPLETE 3+ VIEW

[wrist pa]
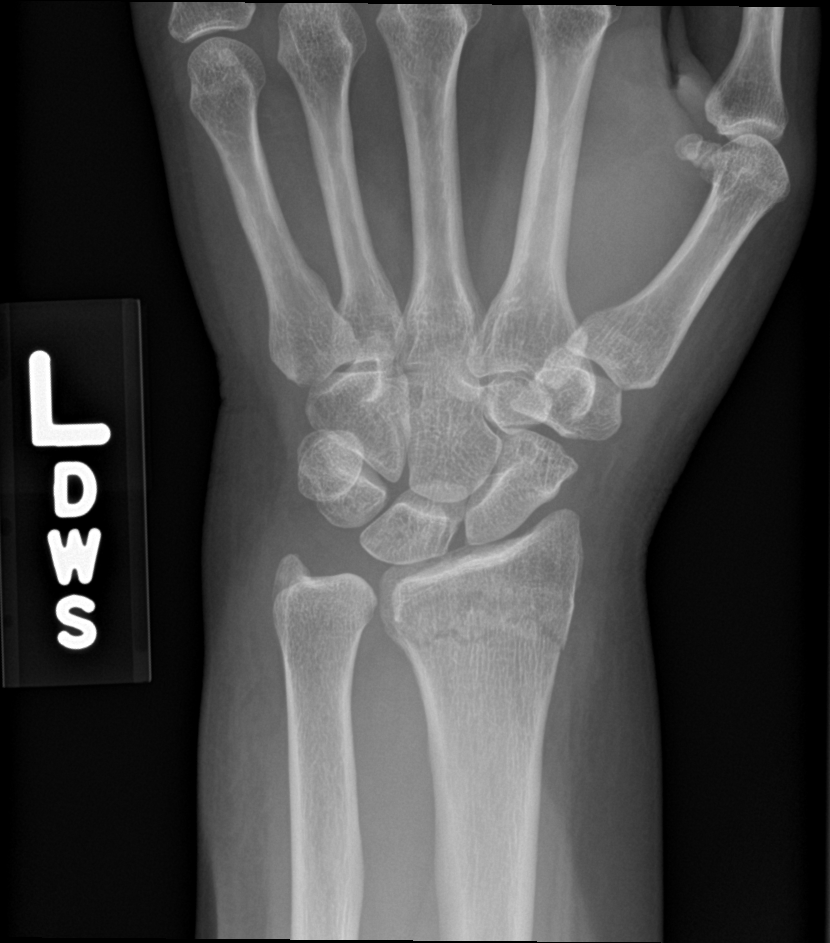

[wrist navicular]
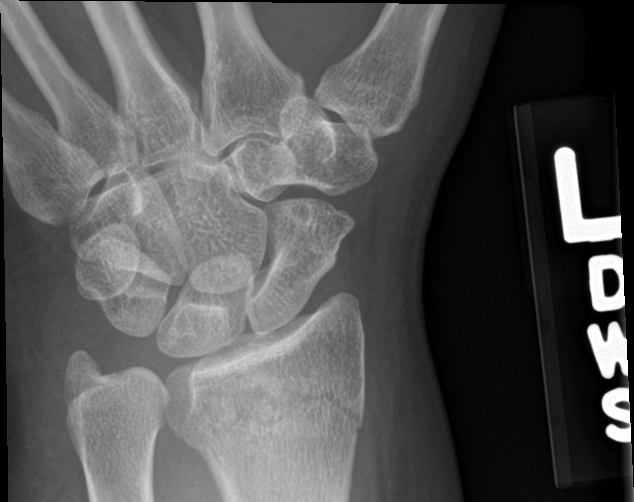

[wrist obl]
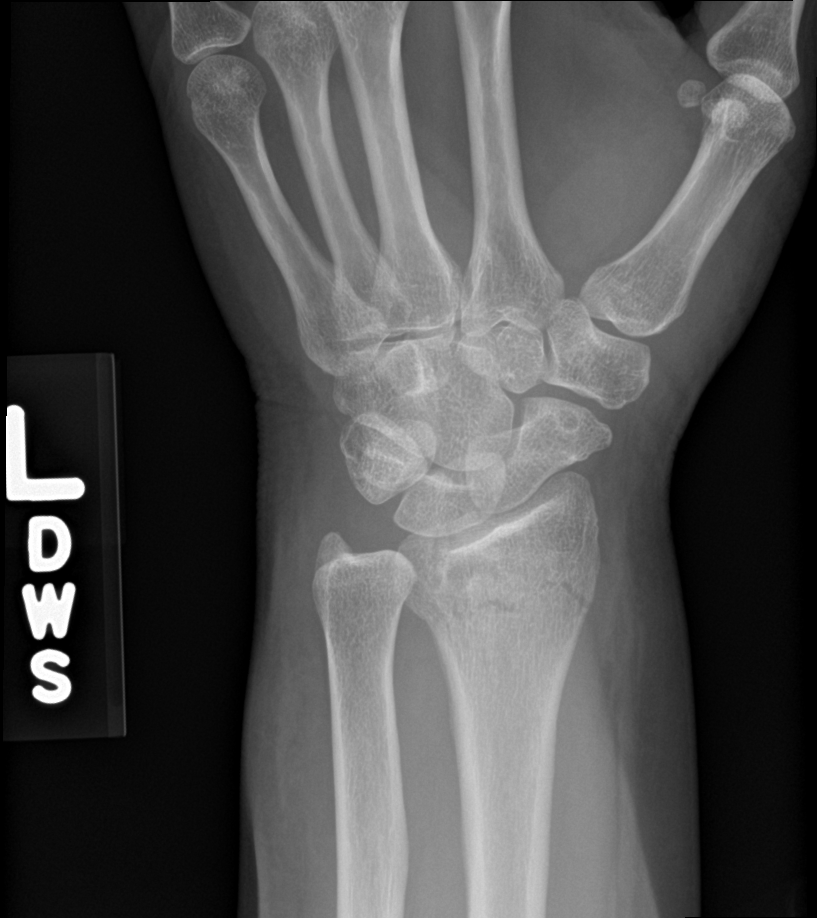

[wrist lat]
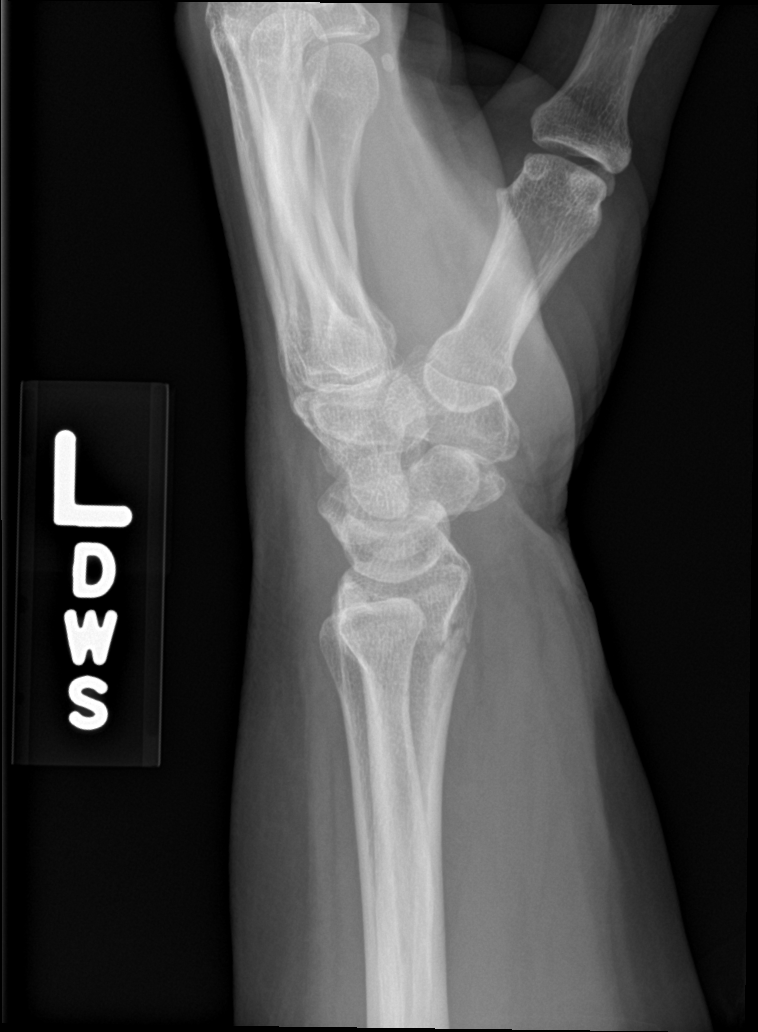

[4 of 4 positions shown; findings below may reference images not displayed]

FINDINGS: There is an acute, nondisplaced fracture through the distal radial
metaphysis. No definite intra-articular extension. No additional
fracture. No dislocation. Joint spaces are preserved. Bone
mineralization is normal. Soft tissue swelling about the wrist.
IMPRESSION: Acute, nondisplaced fracture of the distal radius.

## 2019-07-03 ENCOUNTER — Ambulatory Visit
Admission: EM | Admit: 2019-07-03 | Discharge: 2019-07-03 | Disposition: A | Discharge status: Home / Self Care | Payer: 59 | Attending: Emergency Medicine | Admitting: Emergency Medicine

## 2019-07-03 DIAGNOSIS — S60812A Abrasion of left wrist, initial encounter: Secondary | ICD-10-CM

## 2019-07-03 DIAGNOSIS — W540XXA Bitten by dog, initial encounter: Secondary | ICD-10-CM

## 2019-07-03 DIAGNOSIS — S80812A Abrasion, left lower leg, initial encounter: Secondary | ICD-10-CM

## 2019-07-03 DIAGNOSIS — S41152A Open bite of left upper arm, initial encounter: Secondary | ICD-10-CM

## 2019-07-03 MED ORDER — AMOXICILLIN-POT CLAVULANATE 875-125 MG PO TABS: 1.0000 | ORAL_TABLET | Freq: Two times a day (BID) | ORAL | 0 refills | Status: AC

## 2019-07-03 NOTE — Discharge Instructions (Addendum)
Please go to the ED to start rabies shots if dog is not up-to-date Patient declines x-ray at this time.  Low suspicion for fracture or retained dog tooth Wash with warm water and mild soap Change dressing daily Augmentin prescribed.  Take as directed and to completion Follow up with PCP if symptoms persists Return or go to the ED if you have any new or worsening symptoms such as increasing redness, swelling, drainage, fever, chills, nausea, chest pain, SOB, etc..Marland Kitchen

## 2019-07-03 NOTE — ED Provider Notes (Addendum)
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  States she was running when two dogs got out and bit her LT wrist.  Reports associated swelling.  Denies painful wrist ROM, deformity, or concern for retained FB.  Believes dog is up-to-date on rabies, however, police are working on finding out this information.  Police report has been filed.  Denies similar symptoms in the past.  Denies fever, chills, nausea, vomiting, purulent drainage, decrease strength or sensation.   Td UTD: Yes. Received tetanus today at PCP, but was unable to be seen by PCP for this complaint  ROS: As per HPI.  All other pertinent ROS negative.     Past Medical History:  Diagnosis Date  . Heart murmur    History reviewed. No pertinent surgical history. No Known Allergies No current facility-administered medications on file prior to encounter.   Current Outpatient Medications on File Prior to Encounter  Medication Sig Dispense Refill  . acetaminophen (TYLENOL) 325 MG tablet Take 650 mg by mouth every 6 (six) hours as needed.    . calcium-vitamin D (OSCAL WITH D) 500-200 MG-UNIT tablet Take 1 tablet by mouth 3 (three) times daily. 90 tablet 12   Social History   Socioeconomic History  . Marital status: Single    Spouse name: Not on file  . Number of children: Not on file  . Years of education: Not on file  . Highest education level: Not on file  Occupational History  . Not on file  Tobacco Use  . Smoking status: Never Smoker  . Smokeless tobacco: Never Used  Substance and Sexual Activity  . Alcohol use: Yes    Alcohol/week: 7.0 standard drinks    Types: 7 Standard drinks or equivalent per week  . Drug use: No  . Sexual activity: Not on file  Other Topics Concern  . Not on file  Social History Narrative  . Not on file   Social  Determinants of Health   Financial Resource Strain:   . Difficulty of Paying Living Expenses:   Food Insecurity:   . Worried About Programme researcher, broadcasting/film/video in the Last Year:   . Barista in the Last Year:   Transportation Needs:   . Freight forwarder (Medical):   Marland Kitchen Lack of Transportation (Non-Medical):   Physical Activity:   . Days of Exercise per Week:   . Minutes of Exercise per Session:   Stress:   . Feeling of Stress :   Social Connections:   . Frequency of Communication with Friends and Family:   . Frequency of Social Gatherings with Friends and Family:   . Attends Religious Services:   . Active Member of Clubs or Organizations:   . Attends Banker Meetings:   Marland Kitchen Marital Status:   Intimate Partner Violence:   . Fear of Current or Ex-Partner:   . Emotionally Abused:   Marland Kitchen Physically Abused:   . Sexually Abused:    Family History  Problem Relation Age of Onset  . Cancer Father   . Heart disease Father   . Hyperlipidemia Father   . Hypertension Father   . Stroke Maternal Grandfather   . Stroke Paternal Grandfather   . Hyperlipidemia Sister      OBJECTIVE:  Vitals:   07/03/19 1829 07/03/19 1832  BP:  (!) 172/83  Pulse:  66  Resp:  18  Temp:  98.6 F (37 C)  TempSrc:  Oral  SpO2:  98%  Weight: 145 lb (65.8 kg)      General appearance: alert; no distress CV: radial pulse 2+; cap refill < 2 seconds Skin: few superficial abrasions to LT posterior wrist /distal forearm with associated swelling, no erythema, drainage or deformity; small superficial abrasion to LT proximal lateral calf, no active bleeding or erythema Extremity:  FROM about the wrist; strength and sensation intact; NTTP Psychological: alert and cooperative; normal mood and affect  ASSESSMENT & PLAN:  1. Dog bite of left upper extremity, initial encounter   2. Abrasion of left wrist, initial encounter   3. Abrasion of left lower extremity, initial encounter     Meds ordered  this encounter  Medications  . amoxicillin-clavulanate (AUGMENTIN) 875-125 MG tablet    Sig: Take 1 tablet by mouth every 12 (twelve) hours for 10 days.    Dispense:  20 tablet    Refill:  0    Order Specific Question:   Supervising Provider    Answer:   Raylene Everts [[id number]]   Please go to the ED to start rabies shots if dog is not up-to-date Patient declines x-ray at this time.  Low suspicion for fracture or retained dog tooth Wash with warm water and mild soap Change dressing daily Augmentin prescribed.  Take as directed and to completion Follow up with PCP if symptoms persists Return or go to the ED if you have any new or worsening symptoms such as increasing redness, swelling, drainage, fever, chills, nausea, chest pain, SOB, etc...    Reviewed expectations re: course of current medical issues. Questions answered. Outlined signs and symptoms indicating need for more acute intervention. Patient verbalized understanding. After Visit Summary given.   Lestine Box, PA-C 07/03/19 [place], [place], PA-C 07/03/19 [phone]

## 2019-07-03 NOTE — ED Triage Notes (Signed)
Pt reports dog bite to L wrist/forearm this afternoon.  Two superficial wounds to left forearm noted.  Abrasion to left shin.  Pt states there were two dogs and unsure which bit her.  Believe they are UTD on rabies but police are confirming.

## 2019-07-04 ENCOUNTER — Emergency Department (HOSPITAL_COMMUNITY)
Admission: EM | Admit: 2019-07-04 | Discharge: 2019-07-04 | Disposition: A | Discharge status: Home / Self Care | Payer: 59 | Attending: Emergency Medicine | Admitting: Emergency Medicine

## 2019-07-04 ENCOUNTER — Other Ambulatory Visit: Payer: Self-pay

## 2019-07-04 ENCOUNTER — Encounter (HOSPITAL_COMMUNITY): Payer: Self-pay | Admitting: Emergency Medicine

## 2019-07-04 DIAGNOSIS — Z203 Contact with and (suspected) exposure to rabies: Secondary | ICD-10-CM | POA: Diagnosis present

## 2019-07-04 DIAGNOSIS — Z2914 Encounter for prophylactic rabies immune globin: Secondary | ICD-10-CM | POA: Insufficient documentation

## 2019-07-04 MED ORDER — RABIES IMMUNE GLOBULIN 150 UNIT/ML IM INJ
20.0000 [IU]/kg | INJECTION | Freq: Once | INTRAMUSCULAR | Status: AC
  Administered 2019-07-04: 1350 [IU]
  Filled 2019-07-04: qty 9

## 2019-07-04 MED ORDER — RABIES VACCINE, PCEC IM SUSR
1.0000 mL | Freq: Once | INTRAMUSCULAR | Status: AC
  Administered 2019-07-04: 19:00:00 1 mL via INTRAMUSCULAR
  Filled 2019-07-04: qty 1

## 2019-07-04 NOTE — Discharge Instructions (Signed)
Get help right away if you: Are bitten by a wild or stray animal. Have had any direct exposure to a bat. Have any symptoms of rabies infection. Have signs that your wound is infected, including: More redness, swelling, or pain around your wound. Fluid or blood coming from your wound. Pus or a bad smell coming from your wound. Your wound feels warm to the touch.

## 2019-07-04 NOTE — ED Provider Notes (Signed)
[facility] EMERGENCY DEPARTMENT Provider Note   CSN: [id number] Arrival date & time: 07/04/19  1648     History Chief Complaint  Patient presents with  . Animal Bite    Madison [name] is a 64 y.o. female presents emergency department for post exposure rabies vaccination.  Patient was bitten by a dog in her neighborhood yesterday.  She was seen at the urgent care and had her tetanus vaccination updated.  She went to the police who then directed her here for vaccination.  She is currently taking Augmentin.  She was bitten on the left wrist.  She denies any fevers, chills, heat, redness or discharge from the wound.    HPI     Past Medical History:  Diagnosis Date  . Heart murmur     Patient Active Problem List   Diagnosis Date Noted  . Fracture, Colles, left, closed 07/25/2017    History reviewed. No pertinent surgical history.   OB History   No obstetric history on file.     Family History  Problem Relation Age of Onset  . Cancer Father   . Heart disease Father   . Hyperlipidemia Father   . Hypertension Father   . Stroke Maternal Grandfather   . Stroke Paternal Grandfather   . Hyperlipidemia Sister     Social History   Tobacco Use  . Smoking status: Never Smoker  . Smokeless tobacco: Never Used  Substance Use Topics  . Alcohol use: Yes    Alcohol/week: 7.0 standard drinks    Types: 7 Standard drinks or equivalent per week  . Drug use: No    Home Medications Prior to Admission medications   Medication Sig Start Date End Date Taking? Authorizing Provider  acetaminophen (TYLENOL) 325 MG tablet Take 650 mg by mouth every 6 (six) hours as needed.    [provider]  amoxicillin-clavulanate (AUGMENTIN) 875-125 MG tablet Take 1 tablet by mouth every 12 (twelve) hours for 10 days. 07/03/19 07/13/19  Wurst, [place], PA-C  calcium-vitamin D (OSCAL WITH D) 500-200 MG-UNIT tablet Take 1 tablet by mouth 3 (three) times daily. 06/24/17   Leandrew Koyanagi, MD     Allergies    Patient has no known allergies.  Review of Systems   Review of Systems Ten systems reviewed and are negative for acute change, except as noted in the HPI.   Physical Exam Updated Vital Signs BP (!) 149/83 (BP Location: Right Arm)   Pulse 73   Temp 98.5 F (36.9 C) (Oral)   Resp 16   Ht 4\' 10"  (1.473 m)   Wt 65.8 kg   SpO2 100%   BMI 30.31 kg/m   Physical Exam Vitals and nursing note reviewed.  Constitutional:      General: She is not in acute distress.    Appearance: She is well-developed. She is not diaphoretic.  HENT:     Head: Normocephalic and atraumatic.  Eyes:     General: No scleral icterus.    Conjunctiva/sclera: Conjunctivae normal.  Cardiovascular:     Rate and Rhythm: Normal rate and regular rhythm.     Heart sounds: Normal heart sounds. No murmur. No friction rub. No gallop.   Pulmonary:     Effort: Pulmonary effort is normal. No respiratory distress.     Breath sounds: Normal breath sounds.  Abdominal:     General: Bowel sounds are normal. There is no distension.     Palpations: Abdomen is soft. There is no mass.  Tenderness: There is no abdominal tenderness. There is no guarding.  Musculoskeletal:     Cervical back: Normal range of motion.     Comments: Bruising and puncture wound to the left wrist.  No weakness of the wrist or fingers.  No heat, redness or swelling.  Skin:    General: Skin is warm and dry.  Neurological:     Mental Status: She is alert and oriented to person, place, and time.  Psychiatric:        Behavior: Behavior normal.     ED Results / Procedures / Treatments   Labs (all labs ordered are listed, but only abnormal results are displayed) Labs Reviewed - No data to display  EKG None  Radiology No results found.  Procedures Procedures (including critical care time)  Medications Ordered in ED Medications  rabies immune globulin (HYPERAB/KEDRAB) injection 1,350 Units (1,350 Units Infiltration Given  07/04/19 1917)  rabies vaccine (RABAVERT) injection 1 mL (1 mL Intramuscular Given 07/04/19 1915)    ED Course  I have reviewed the triage vital signs and the nursing notes.  Pertinent labs & imaging results that were available during my care of the patient were reviewed by me and considered in my medical decision making (see chart for details).    MDM Rules/Calculators/A&P                      Patient here for post animal bite rabies prophylaxis.  She was given her immunoglobulin and vaccination with out any reaction or intolerance.  She was given a schedule for follow-up at the [facility] urgent care.  She appears otherwise appropriate for discharge at this time. Final Clinical Impression(s) / ED Diagnoses Final diagnoses:  None    Rx / DC Orders ED Discharge Orders    None       Arthor Captain, PA-C 07/04/19 2237    Bethann Berkshire, MD 07/07/19 [phone]

## 2019-07-04 NOTE — ED Triage Notes (Signed)
Patient states she was bitten on left forearm this today by dog. Patient was given a tetanus vaccination today and was seen at [facility].The dogs are not up to date on their rabies vaccination and was sent here for rabies vaccination.

## 2019-07-06 ENCOUNTER — Ambulatory Visit
Admission: EM | Admit: 2019-07-06 | Discharge: 2019-07-06 | Disposition: A | Discharge status: Home / Self Care | Payer: 59

## 2019-07-06 DIAGNOSIS — Z203 Contact with and (suspected) exposure to rabies: Secondary | ICD-10-CM

## 2019-07-07 ENCOUNTER — Other Ambulatory Visit: Payer: Self-pay

## 2019-07-07 ENCOUNTER — Ambulatory Visit
Admission: EM | Admit: 2019-07-07 | Discharge: 2019-07-07 | Disposition: A | Discharge status: Home / Self Care | Payer: 59 | Attending: Emergency Medicine | Admitting: Emergency Medicine

## 2019-07-07 DIAGNOSIS — Z23 Encounter for immunization: Secondary | ICD-10-CM

## 2019-07-07 DIAGNOSIS — Z203 Contact with and (suspected) exposure to rabies: Secondary | ICD-10-CM | POA: Diagnosis not present

## 2019-07-07 MED ORDER — RABIES VACCINE, PCEC IM SUSR
1.0000 mL | Freq: Once | INTRAMUSCULAR | Status: AC
  Administered 2019-07-07: 1 mL via INTRAMUSCULAR

## 2019-07-07 NOTE — ED Triage Notes (Signed)
Pt presents for day 3 rabies vaccine today.

## 2019-07-08 ENCOUNTER — Telehealth: Payer: Self-pay | Admitting: Emergency Medicine

## 2019-07-08 NOTE — Telephone Encounter (Signed)
Entered in error

## 2019-07-11 ENCOUNTER — Other Ambulatory Visit: Payer: Self-pay

## 2019-07-11 ENCOUNTER — Encounter: Payer: Self-pay | Admitting: Emergency Medicine

## 2019-07-11 ENCOUNTER — Ambulatory Visit
Admission: EM | Admit: 2019-07-11 | Discharge: 2019-07-11 | Disposition: A | Discharge status: Home / Self Care | Payer: 59 | Attending: Internal Medicine | Admitting: Internal Medicine

## 2019-07-11 DIAGNOSIS — Z23 Encounter for immunization: Secondary | ICD-10-CM

## 2019-07-11 DIAGNOSIS — Z203 Contact with and (suspected) exposure to rabies: Secondary | ICD-10-CM

## 2019-07-11 MED ORDER — RABIES VACCINE, PCEC IM SUSR
1.0000 mL | Freq: Once | INTRAMUSCULAR | Status: AC
  Administered 2019-07-11: 1 mL via INTRAMUSCULAR

## 2019-07-11 NOTE — ED Triage Notes (Signed)
patient is here today for day #7 in rabies series.  Patient denies any complaints or congerns

## 2019-07-11 NOTE — Discharge Instructions (Signed)
Return as scheduled 

## 2019-07-18 ENCOUNTER — Ambulatory Visit
Admission: EM | Admit: 2019-07-18 | Discharge: 2019-07-18 | Disposition: A | Discharge status: Home / Self Care | Payer: 59 | Attending: Emergency Medicine | Admitting: Emergency Medicine

## 2019-07-18 ENCOUNTER — Other Ambulatory Visit: Payer: Self-pay

## 2019-07-18 DIAGNOSIS — Z203 Contact with and (suspected) exposure to rabies: Secondary | ICD-10-CM | POA: Diagnosis not present

## 2019-07-18 DIAGNOSIS — Z23 Encounter for immunization: Secondary | ICD-10-CM | POA: Diagnosis not present

## 2019-07-18 MED ORDER — RABIES VACCINE, PCEC IM SUSR
1.0000 mL | Freq: Once | INTRAMUSCULAR | Status: AC
  Administered 2019-07-18: 11:00:00 1 mL via INTRAMUSCULAR

## 2019-07-18 NOTE — ED Triage Notes (Signed)
Needs final rabies vaccination

## 2019-11-08 ENCOUNTER — Encounter (HOSPITAL_COMMUNITY): Payer: Self-pay

## 2019-11-08 ENCOUNTER — Other Ambulatory Visit: Payer: Self-pay

## 2019-11-08 DIAGNOSIS — Y92009 Unspecified place in unspecified non-institutional (private) residence as the place of occurrence of the external cause: Secondary | ICD-10-CM | POA: Diagnosis not present

## 2019-11-08 DIAGNOSIS — W268XXA Contact with other sharp object(s), not elsewhere classified, initial encounter: Secondary | ICD-10-CM | POA: Diagnosis not present

## 2019-11-08 DIAGNOSIS — Y9389 Activity, other specified: Secondary | ICD-10-CM | POA: Diagnosis not present

## 2019-11-08 DIAGNOSIS — Y999 Unspecified external cause status: Secondary | ICD-10-CM | POA: Insufficient documentation

## 2019-11-08 DIAGNOSIS — S61011A Laceration without foreign body of right thumb without damage to nail, initial encounter: Secondary | ICD-10-CM | POA: Insufficient documentation

## 2019-11-08 NOTE — ED Triage Notes (Signed)
Pt was slicing zucchini at home with a mandolin and lacerated her right thumb. Bleeding controlled at this time in Triage.

## 2019-11-09 ENCOUNTER — Emergency Department (HOSPITAL_COMMUNITY)
Admission: EM | Admit: 2019-11-09 | Discharge: 2019-11-09 | Disposition: A | Discharge status: Home / Self Care | Payer: 59 | Attending: Emergency Medicine | Admitting: Emergency Medicine

## 2019-11-09 DIAGNOSIS — S61011A Laceration without foreign body of right thumb without damage to nail, initial encounter: Secondary | ICD-10-CM

## 2019-11-09 NOTE — ED Provider Notes (Signed)
[facility] EMERGENCY DEPARTMENT Provider Note   CSN: [id number] Arrival date & time: 11/08/19  2200     History Chief Complaint  Patient presents with  . Laceration    right thumb laceration    Madison [name] is a 64 y.o. female.  The history is provided by the patient.  Laceration Location:  Finger Finger laceration location:  R thumb Pain details:    Quality:  Aching   Severity:  Moderate   Timing:  Constant   Progression:  Unchanged Relieved by:  None tried Worsened by:  Nothing Associated symptoms: no fever, no focal weakness and no numbness    Patient cut her right thumb tonight while slicing zucchini.  Bleeding controlled.    Past Medical History:  Diagnosis Date  . Heart murmur     Patient Active Problem List   Diagnosis Date Noted  . Fracture, Colles, left, closed 07/25/2017    History reviewed. No pertinent surgical history.   OB History    Gravida  0   Para      Term      Preterm      AB      Living        SAB      TAB      Ectopic      Multiple      Live Births              Family History  Problem Relation Age of Onset  . Cancer Father   . Heart disease Father   . Hyperlipidemia Father   . Hypertension Father   . Stroke Maternal Grandfather   . Stroke Paternal Grandfather   . Hyperlipidemia Sister     Social History   Tobacco Use  . Smoking status: Never Smoker  . Smokeless tobacco: Never Used  Vaping Use  . Vaping Use: Never used  Substance Use Topics  . Alcohol use: Yes    Alcohol/week: 7.0 standard drinks    Types: 7 Standard drinks or equivalent per week  . Drug use: No    Home Medications Prior to Admission medications   Medication Sig Start Date End Date Taking? Authorizing Provider  acetaminophen (TYLENOL) 325 MG tablet Take 650 mg by mouth every 6 (six) hours as needed.    [provider]  calcium-vitamin D (OSCAL WITH D) 500-200 MG-UNIT tablet Take 1 tablet by mouth 3 (three) times daily.  06/24/17   Tarry Kos, MD    Allergies    Patient has no known allergies.  Review of Systems   Review of Systems  Constitutional: Negative for fever.  Skin: Positive for wound.  Neurological: Negative for focal weakness.    Physical Exam Updated Vital Signs BP (!) 161/96   Pulse 65   Temp 98.4 F (36.9 C)   Resp 19   Ht 1.499 m (4\' 11" )   Wt 65.8 kg   SpO2 100%   BMI 29.29 kg/m   Physical Exam CONSTITUTIONAL: Well developed/well nourished HEAD: Normocephalic/atraumatic EYES: EOMI ENMT: Mask in place NECK: supple no meningeal signs LUNGS:  no apparent distress ABDOMEN: soft NEURO: Pt is awake/alert/appropriate, moves all extremitiesx4.  No facial droop.  No weakness noted in the right hand or thumb EXTREMITIES: pulses normal/equal, full ROM, laceration noted to the palmar aspect of right thumb.  Bleeding controlled.  No bone or tendon exposed.  Full flexion extension is noted of the right thumb. SKIN: warm, color normal, laceration noted see photo PSYCH:  no abnormalities of mood noted, alert and oriented to situation    ED Results / Procedures / Treatments   Labs (all labs ordered are listed, but only abnormal results are displayed) Labs Reviewed - No data to display  EKG None  Radiology No results found.  Procedures .Marland KitchenLaceration Repair  Date/Time: 11/09/2019 2:14 AM Performed by: Zadie Rhine, MD Authorized by: Zadie Rhine, MD   Consent:    Consent obtained:  Verbal   Consent given by:  Patient Anesthesia (see MAR for exact dosages):    Anesthesia method:  Local infiltration   Local anesthetic:  Lidocaine 1% w/o epi Laceration details:    Location:  Finger   Finger location:  R thumb   Length (cm):  3 Repair type:    Repair type:  Simple Pre-procedure details:    Preparation:  Patient was prepped and draped in usual sterile fashion Exploration:    Wound exploration: wound explored through full range of motion and entire depth of  wound probed and visualized     Wound extent: no tendon damage noted and no underlying fracture noted   Treatment:    Area cleansed with:  Betadine Skin repair:    Repair method:  Sutures   Suture size:  5-0   Suture material:  Prolene   Number of sutures:  4 Approximation:    Approximation:  Loose Post-procedure details:    Patient tolerance of procedure:  Tolerated well, no immediate complications     Medications Ordered in ED Medications - No data to display  ED Course  I have reviewed the triage vital signs and the nursing notes.     MDM Rules/Calculators/A&P                          Patient tolerated procedure well.  We discussed wound care instructions Final Clinical Impression(s) / ED Diagnoses Final diagnoses:  Laceration of right thumb without foreign body without damage to nail, initial encounter    Rx / DC Orders ED Discharge Orders    None       Zadie Rhine, MD 11/09/19 [phone]

## 2019-11-09 NOTE — ED Notes (Signed)
Pt soaking laceration in saline/providone solution at bedside. Suture Cart at bedside

## 2019-11-27 ENCOUNTER — Encounter (INDEPENDENT_AMBULATORY_CARE_PROVIDER_SITE_OTHER): Payer: Self-pay | Admitting: *Deleted
